# Patient Record
Sex: Male | Born: 1960 | Race: Black or African American | Hispanic: No | Marital: Married | Smoking: Current every day smoker
Health system: Southern US, Community
[De-identification: ages and names within clinical notes are randomized; demographics above are authoritative.]

## PROBLEM LIST (undated history)

## (undated) DIAGNOSIS — I10 Essential (primary) hypertension: Secondary | ICD-10-CM

## (undated) HISTORY — PX: NO PAST SURGERIES: SHX2092

---

## 2009-01-16 ENCOUNTER — Ambulatory Visit: Payer: Self-pay | Admitting: Internal Medicine

## 2009-01-25 ENCOUNTER — Ambulatory Visit: Payer: Self-pay | Admitting: Internal Medicine

## 2010-10-20 ENCOUNTER — Ambulatory Visit: Payer: Self-pay | Admitting: Internal Medicine

## 2014-02-25 ENCOUNTER — Ambulatory Visit: Payer: Self-pay | Admitting: Family Medicine

## 2015-11-14 ENCOUNTER — Encounter: Payer: Self-pay | Admitting: Emergency Medicine

## 2015-11-14 ENCOUNTER — Ambulatory Visit
Admission: EM | Admit: 2015-11-14 | Discharge: 2015-11-14 | Disposition: A | Discharge status: Home / Self Care | Payer: BLUE CROSS/BLUE SHIELD | Attending: Family Medicine | Admitting: Family Medicine

## 2015-11-14 DIAGNOSIS — J101 Influenza due to other identified influenza virus with other respiratory manifestations: Secondary | ICD-10-CM | POA: Diagnosis not present

## 2015-11-14 LAB — RAPID INFLUENZA A&B ANTIGENS (ARMC ONLY)
INFLUENZA A (ARMC): NOT DETECTED
INFLUENZA B (ARMC): DETECTED

## 2015-11-14 MED ORDER — ACETAMINOPHEN 500 MG PO TABS: 1000.0000 mg | ORAL_TABLET | Freq: Once | ORAL | Status: DC

## 2015-11-14 MED ORDER — GUAIFENESIN-CODEINE 100-10 MG/5ML PO SOLN
5.0000 mL | Freq: Three times a day (TID) | ORAL | Status: DC | PRN
Start: 2015-11-14 — End: 2020-06-02

## 2015-11-14 MED ORDER — OSELTAMIVIR PHOSPHATE 75 MG PO CAPS: 75.0000 mg | ORAL_CAPSULE | Freq: Two times a day (BID) | ORAL | Status: DC

## 2015-11-14 NOTE — Discharge Instructions (Signed)
Take medication as prescribed. Rest. Drink plenty of fluids. Take over the counter tylenol or ibuprofen as needed for pain or fever.  ° °Follow up with your primary care physician this week as needed. Return to Urgent care for new or worsening concerns.  ° ° °Influenza, Adult °Influenza ("the flu") is a viral infection of the respiratory tract. It occurs more often in winter months because people spend more time in close contact with one another. Influenza can make you feel very sick. Influenza easily spreads from person to person (contagious). °CAUSES  °Influenza is caused by a virus that infects the respiratory tract. You can catch the virus by breathing in droplets from an infected person's cough or sneeze. You can also catch the virus by touching something that was recently contaminated with the virus and then touching your mouth, nose, or eyes. °RISKS AND COMPLICATIONS °You may be at risk for a more severe case of influenza if you smoke cigarettes, have diabetes, have chronic heart disease (such as heart failure) or lung disease (such as asthma), or if you have a weakened immune system. Elderly people and pregnant women are also at risk for more serious infections. The most common problem of influenza is a lung infection (pneumonia). Sometimes, this problem can require emergency medical care and may be life threatening. °SIGNS AND SYMPTOMS  °Symptoms typically last 4 to 10 days and may include: °· Fever. °· Chills. °· Headache, body aches, and muscle aches. °· Sore throat. °· Chest discomfort and cough. °· Poor appetite. °· Weakness or feeling tired. °· Dizziness. °· Nausea or vomiting. °DIAGNOSIS  °Diagnosis of influenza is often made based on your history and a physical exam. A nose or throat swab test can be done to confirm the diagnosis. °TREATMENT  °In mild cases, influenza goes away on its own. Treatment is directed at relieving symptoms. For more severe cases, your health care provider may prescribe  antiviral medicines to shorten the sickness. Antibiotic medicines are not effective because the infection is caused by a virus, not by bacteria. °HOME CARE INSTRUCTIONS °· Take medicines only as directed by your health care provider. °· Use a cool mist humidifier to make breathing easier. °· Get plenty of rest until your temperature returns to normal. This usually takes 3 to 4 days. °· Drink enough fluid to keep your urine clear or pale yellow. °· Cover your mouth and nose when coughing or sneezing, and wash your hands well to prevent the virus from spreading. °· Stay home from work or school until the fever is gone for at least 1 full day. °PREVENTION  °An annual influenza vaccination (flu shot) is the best way to avoid getting influenza. An annual flu shot is now routinely recommended for all adults in the [place] °SEEK MEDICAL CARE IF: °· You experience chest pain, your cough worsens, or you produce more mucus. °· You have nausea, vomiting, or diarrhea. °· Your fever returns or gets worse. °SEEK IMMEDIATE MEDICAL CARE IF: °· You have trouble breathing, you become short of breath, or your skin or nails become bluish. °· You have severe pain or stiffness in the neck. °· You develop a sudden headache, or pain in the face or ear. °· You have nausea or vomiting that you cannot control. °MAKE SURE YOU:  °· Understand these instructions. °· Will watch your condition. °· Will get help right away if you are not doing well or get worse. °  °This information is not intended to replace advice given to you by your health care provider. Make sure you discuss any questions you have with your health care provider. °  °Document Released: 09/05/2000 Document   09/29/2014 Document Reviewed: 12/08/2011 Elsevier Interactive Patient Education [employer].

## 2015-11-14 NOTE — ED Provider Notes (Signed)
[facility]  ____________________________________________  Time seen: Approximately 11:32 AM  I have reviewed the triage vital signs and the nursing notes.   HISTORY  Chief Complaint Influenza   HPI Phillip Nichols is a 55 y.o. male  presents with wife at bedside for the complaints of 2 days of sudden onset of fever, cough, congestion, runny nose. Reports continues to eat and drink well. Reports cough is dry. Reports cough kept him up last night from sleeping. Reports only took ibuprofen once yesterday and has not taken any other over-the-counter cough, congestion, Tylenol or ibuprofen medications. States generalized body aches when fever is present. Denies other pain. Denies current pain.  Denies chest pain, shortness of breath, abdominal pain, dysuria, neck or back pain, dizziness or weakness.  Reports positive influenza exposure by son this past weekend. Also reports several coworkers sick with similar symptoms.   No past medical history on file.  There are no active problems to display for this patient.   No past surgical history on file.  Current Outpatient Rx  Name  Route  Sig  Dispense  Refill  .           Marland Kitchen             Allergies Review of patient's allergies indicates no known allergies.  History reviewed. No pertinent family history.  Social History Social History  Substance Use Topics  . Smoking status: Current Every Day Smoker -- 0.50 packs/day    Types: Cigarettes  . Smokeless tobacco: None  . Alcohol Use: Yes     Comment: occassional     Review of Systems Constitutional: Positive subjective fevers.  Eyes: No visual changes. ENT: No sore throat.Positive runny nose, nasal congestion, cough.  Cardiovascular: Denies chest pain. Respiratory: Denies shortness of breath. Gastrointestinal: No abdominal pain.  No nausea, no vomiting.  No diarrhea.  No constipation. Genitourinary: Negative for dysuria. Musculoskeletal: Negative for back pain. Skin:  Negative for rash. Neurological: Negative for headaches, focal weakness or numbness.  10-point ROS otherwise negative.  ____________________________________________   PHYSICAL EXAM:  VITAL SIGNS: ED Triage Vitals  Enc Vitals Group     BP 11/14/15 1020 145/89 mmHg     Pulse Rate 11/14/15 1020 111     Resp 11/14/15 1020 18     Temp 11/14/15 1020 100.4 F (38 C)     Temp Source 11/14/15 1020 Tympanic     SpO2 11/14/15 1020 98 %     Weight 11/14/15 1020 185 lb (83.915 kg)     Height 11/14/15 1020  (1.778 m)     Head Cir --      Peak Flow --      Pain Score 11/14/15 1021 8     Pain Loc --      Pain Edu? --      Excl. in GC? --     Constitutional: Alert and oriented. Well appearing and in no acute distress. Eyes: Conjunctivae are normal. PERRL. EOMI. Head: Atraumatic.No sinus tenderness to palpation. No swelling. No erythema.  Ears: no erythema, normal TMs bilaterally.   Nose:Nasal congestion with clear rhinorrhea.  Mouth/Throat: Mucous membranes are moist.  Oropharynx non-erythematous.No tonsillar swelling or exudate.  Neck: No stridor.  No cervical spine tenderness to palpation. Hematological/Lymphatic/Immunilogical: No cervical lymphadenopathy. Cardiovascular: Normal rate, regular rhythm. Grossly normal heart sounds.  Good peripheral circulation. Respiratory: Normal respiratory effort.  No retractions. Lungs CTAB.No wheezes, rales or rhonchi. Good air movement.  Gastrointestinal: Soft and nontender. No distention.  Normal Bowel sounds. No CVA tenderness. Musculoskeletal: No lower or upper extremity tenderness nor edema.  Bilateral pedal pulses equal and easily palpated.  Neurologic:  Normal speech and language. No gross focal neurologic deficits are appreciated. No gait instability. Skin:  Skin is warm, dry and intact. No rash noted. Psychiatric: Mood and affect are normal. Speech and behavior are normal.  ____________________________________________   LABS (all  labs ordered are listed, but only abnormal results are displayed)  Labs Reviewed  RAPID INFLUENZA A&B ANTIGENS ([facility] ONLY)    INITIAL IMPRESSION / ASSESSMENT AND PLAN / ED COURSE  Pertinent labs & imaging results that were available during my care of the patient were reviewed by me and considered in my medical decision making (see chart for details).  Very well-appearing patient. No acute distress. Presents with wife at bedside with complaints of 2 days of runny nose, nasal congestion, bodies and intermittent fever. Reports continues to eat and drink well. Lungs clear throughout. Abdomen soft and nontender. Moist membranes. Suspect viral upper respiratory infection such as influenza especially with known positive exposure. will evaluate for influenza.   Influenza B positive. Will treat patient with oral Tamiflu and when necessary guaifenesin with codeine as needed. Discussed do not drive while taking codeine. Encouraged rest, fluids, over-the-counter Tylenol or ibuprofen as needed. Tylenol 1 g orally given in urgent care. Encourage PCP follow up as needed.  Discussed follow up with Primary care physician this week. Discussed follow up and return parameters including no resolution or any worsening concerns. Patient verbalized understanding and agreed to plan.   ____________________________________________   FINAL CLINICAL IMPRESSION(S) / ED DIAGNOSES  Final diagnoses:  Influenza B      Note: This dictation was prepared with Dragon dictation along with smaller phrase technology. Any transcriptional errors that result from this process are unintentional.    Renford Dills, NP 11/14/15 1137

## 2015-11-14 NOTE — ED Notes (Signed)
Pt reports flu like symptoms including fever, cough, congestion, and body aches that started about 2 days ago

## 2018-10-15 ENCOUNTER — Other Ambulatory Visit: Payer: Self-pay

## 2018-10-15 ENCOUNTER — Emergency Department
Admission: EM | Admit: 2018-10-15 | Discharge: 2018-10-15 | Disposition: A | Discharge status: Home / Self Care | Payer: BLUE CROSS/BLUE SHIELD | Attending: Emergency Medicine | Admitting: Emergency Medicine

## 2018-10-15 ENCOUNTER — Emergency Department: Payer: BLUE CROSS/BLUE SHIELD

## 2018-10-15 DIAGNOSIS — S82891A Other fracture of right lower leg, initial encounter for closed fracture: Secondary | ICD-10-CM | POA: Diagnosis not present

## 2018-10-15 DIAGNOSIS — Y999 Unspecified external cause status: Secondary | ICD-10-CM | POA: Diagnosis not present

## 2018-10-15 DIAGNOSIS — S161XXA Strain of muscle, fascia and tendon at neck level, initial encounter: Secondary | ICD-10-CM | POA: Diagnosis not present

## 2018-10-15 DIAGNOSIS — F17210 Nicotine dependence, cigarettes, uncomplicated: Secondary | ICD-10-CM | POA: Insufficient documentation

## 2018-10-15 DIAGNOSIS — Z79899 Other long term (current) drug therapy: Secondary | ICD-10-CM | POA: Insufficient documentation

## 2018-10-15 DIAGNOSIS — Y92410 Unspecified street and highway as the place of occurrence of the external cause: Secondary | ICD-10-CM | POA: Insufficient documentation

## 2018-10-15 DIAGNOSIS — Y9389 Activity, other specified: Secondary | ICD-10-CM | POA: Insufficient documentation

## 2018-10-15 DIAGNOSIS — S199XXA Unspecified injury of neck, initial encounter: Secondary | ICD-10-CM | POA: Diagnosis present

## 2018-10-15 MED ORDER — IBUPROFEN 600 MG PO TABS
600.0000 mg | ORAL_TABLET | Freq: Once | ORAL | Status: AC
  Administered 2018-10-15: 600 mg via ORAL
  Filled 2018-10-15: qty 1

## 2018-10-15 MED ORDER — CYCLOBENZAPRINE HCL 10 MG PO TABS: 10.0000 mg | ORAL_TABLET | Freq: Three times a day (TID) | ORAL | 0 refills | Status: DC | PRN

## 2018-10-15 MED ORDER — TRAMADOL HCL 50 MG PO TABS
50.0000 mg | ORAL_TABLET | Freq: Once | ORAL | Status: AC
Start: 2018-10-15 — End: 2018-10-15
  Administered 2018-10-15: 50 mg via ORAL
  Filled 2018-10-15: qty 1

## 2018-10-15 MED ORDER — CYCLOBENZAPRINE HCL 10 MG PO TABS
10.0000 mg | ORAL_TABLET | Freq: Once | ORAL | Status: AC
  Administered 2018-10-15: 10 mg via ORAL
  Filled 2018-10-15: qty 1

## 2018-10-15 MED ORDER — IBUPROFEN 600 MG PO TABS: 600.0000 mg | ORAL_TABLET | Freq: Three times a day (TID) | ORAL | 0 refills | Status: DC | PRN

## 2018-10-15 MED ORDER — TRAMADOL HCL 50 MG PO TABS: 50.0000 mg | ORAL_TABLET | Freq: Two times a day (BID) | ORAL | 0 refills | Status: DC | PRN

## 2018-10-15 NOTE — Discharge Instructions (Addendum)
Wear splint and if needed ambulate with crutches until evaluation by orthopedics.
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
## 2020-06-02 ENCOUNTER — Ambulatory Visit
Admission: EM | Admit: 2020-06-02 | Discharge: 2020-06-02 | Disposition: A | Discharge status: Home / Self Care | Payer: BC Managed Care – PPO | Attending: Family | Admitting: Family

## 2020-06-02 ENCOUNTER — Other Ambulatory Visit: Payer: Self-pay

## 2020-06-02 DIAGNOSIS — R062 Wheezing: Secondary | ICD-10-CM

## 2020-06-02 DIAGNOSIS — J209 Acute bronchitis, unspecified: Secondary | ICD-10-CM

## 2020-06-02 DIAGNOSIS — R030 Elevated blood-pressure reading, without diagnosis of hypertension: Secondary | ICD-10-CM | POA: Diagnosis not present

## 2020-06-02 DIAGNOSIS — M898X1 Other specified disorders of bone, shoulder: Secondary | ICD-10-CM

## 2020-06-02 DIAGNOSIS — M25511 Pain in right shoulder: Secondary | ICD-10-CM | POA: Diagnosis not present

## 2020-06-02 DIAGNOSIS — J0100 Acute maxillary sinusitis, unspecified: Secondary | ICD-10-CM | POA: Diagnosis present

## 2020-06-02 DIAGNOSIS — Z20822 Contact with and (suspected) exposure to covid-19: Secondary | ICD-10-CM | POA: Insufficient documentation

## 2020-06-02 MED ORDER — ALBUTEROL SULFATE HFA 108 (90 BASE) MCG/ACT IN AERS: 2.0000 | INHALATION_SPRAY | Freq: Four times a day (QID) | RESPIRATORY_TRACT | 0 refills | Status: AC | PRN

## 2020-06-02 MED ORDER — PREDNISONE 20 MG PO TABS
40.0000 mg | ORAL_TABLET | Freq: Every day | ORAL | 0 refills | Status: AC
Start: 2020-06-02 — End: 2020-06-06

## 2020-06-02 MED ORDER — DOXYCYCLINE HYCLATE 100 MG PO CAPS: 100.0000 mg | ORAL_CAPSULE | Freq: Two times a day (BID) | ORAL | 0 refills | Status: AC

## 2020-06-02 NOTE — ED Provider Notes (Signed)
[facility]    CSN: [id number] Arrival date & time: 06/02/20  7035      History   Chief Complaint Chief Complaint  Patient presents with  . Cough    HPI Phillip Nichols is a 59 y.o. male.   59 year old male presents with nasal congestion, cough and chest congestion for the past 5 to 6 days. Denies any fever, sore throat or vomiting but has felt nauseous. Also started having right upper shoulder blade pain yesterday. Has been taking Nyquil and Mucinex with some relief. No known exposure to COVID 19. No other chronic health issues except tobacco use but has noted that blood pressure has been mildly elevated in the past. Has routine appointment with his PCP in 2 days. Takes no daily medication.   The history is provided by the patient.    History reviewed. No pertinent past medical history.  There are no problems to display for this patient.   Past Surgical History:  Procedure Laterality Date  . NO PAST SURGERIES         Home Medications    Prior to Admission medications   Medication Sig Start Date End Date Taking? Authorizing Provider  albuterol (VENTOLIN HFA) 108 (90 Base) MCG/ACT inhaler Inhale 2 puffs into the lungs every 6 (six) hours as needed for wheezing. 06/02/20   Sudie Grumbling, NP  doxycycline (VIBRAMYCIN) 100 MG capsule Take 1 capsule (100 mg total) by mouth 2 (two) times daily for 7 days. 06/02/20 06/09/20  Sudie Grumbling, NP  predniSONE (DELTASONE) 20 MG tablet Take 2 tablets (40 mg total) by mouth daily for 4 days. 06/02/20 06/06/20  Sudie Grumbling, NP    Family History Family History  Problem Relation Age of Onset  . Healthy Mother   . Healthy Father     Social History Social History   Tobacco Use  . Smoking status: Current Every Day Smoker    Packs/day: 0.50    Types: Cigarettes  . Smokeless tobacco: Never Used  Vaping Use  . Vaping Use: Never used  Substance Use Topics  . Alcohol use: Yes    Comment: occasional   . Drug use:  Yes    Types: Marijuana     Allergies   Patient has no known allergies.   Review of Systems Review of Systems  Constitutional: Positive for appetite change, chills and fatigue. Negative for activity change and fever.  HENT: Positive for congestion, postnasal drip, rhinorrhea, sinus pressure and sinus pain. Negative for ear discharge, ear pain, facial swelling, mouth sores, nosebleeds, sore throat and trouble swallowing.   Eyes: Negative for pain, discharge, redness and itching.  Respiratory: Positive for cough and chest tightness. Negative for wheezing.   Cardiovascular: Negative for chest pain.  Gastrointestinal: Positive for nausea. Negative for diarrhea and vomiting.  Musculoskeletal: Positive for arthralgias, back pain and myalgias. Negative for neck pain and neck stiffness.  Skin: Negative for color change, rash and wound.  Allergic/Immunologic: Negative for environmental allergies, food allergies and immunocompromised state.  Neurological: Positive for headaches. Negative for dizziness, seizures, syncope, weakness, light-headedness and numbness.  Hematological: Negative for adenopathy. Does not bruise/bleed easily.     Physical Exam Triage Vital Signs ED Triage Vitals  Enc Vitals Group     BP 06/02/20 0943 (!) 156/100     Pulse Rate 06/02/20 0943 79     Resp 06/02/20 0943 18     Temp 06/02/20 0943 98 F (36.7 C)  Temp Source 06/02/20 0943 Oral     SpO2 06/02/20 0943 99 %     Weight 06/02/20 0939 180 lb (81.6 kg)     Height 06/02/20 0939 5\' 9"  (1.753 m)     Head Circumference --      Peak Flow --      Pain Score 06/02/20 0939 6     Pain Loc --      Pain Edu? --      Excl. in GC? --    No data found.  Updated Vital Signs BP (!) 156/100 (BP Location: Right Arm)   Pulse 79   Temp 98 F (36.7 C) (Oral)   Resp 18   Ht 5\' 9"  (1.753 m)   Wt 180 lb (81.6 kg)   SpO2 99%   BMI 26.58 kg/m   Visual Acuity Right Eye Distance:   Left Eye Distance:   Bilateral  Distance:    Right Eye Near:   Left Eye Near:    Bilateral Near:     Physical Exam Vitals and nursing note reviewed.  Constitutional:      General: He is awake. He is not in acute distress.    Appearance: He is well-developed and well-groomed. He is ill-appearing.     Comments: He is sitting on the exam table in no acute distress but appears ill.   HENT:     Head: Normocephalic and atraumatic.     Right Ear: Hearing, tympanic membrane, ear canal and external ear normal.     Left Ear: Hearing, tympanic membrane, ear canal and external ear normal.     Nose: Congestion present.     Right Sinus: Maxillary sinus tenderness present. No frontal sinus tenderness.     Left Sinus: Maxillary sinus tenderness present. No frontal sinus tenderness.     Mouth/Throat:     Lips: Pink.     Mouth: Mucous membranes are moist.     Pharynx: Oropharynx is clear. Uvula midline. Posterior oropharyngeal erythema present. No pharyngeal swelling, oropharyngeal exudate or uvula swelling.  Eyes:     Extraocular Movements: Extraocular movements intact.     Conjunctiva/sclera: Conjunctivae normal.  Cardiovascular:     Rate and Rhythm: Normal rate and regular rhythm.     Heart sounds: Normal heart sounds. No murmur heard.   Pulmonary:     Effort: Pulmonary effort is normal. No respiratory distress.     Breath sounds: Normal air entry. No decreased air movement. Examination of the right-lower field reveals wheezing and rhonchi. Examination of the left-lower field reveals wheezing and rhonchi. Wheezing and rhonchi present. No decreased breath sounds or rales.       Comments: Right upper scapular tenderness and muscle spasms present.  Musculoskeletal:        General: Tenderness present.     Cervical back: Normal range of motion and neck supple. No rigidity.  Lymphadenopathy:     Cervical: No cervical adenopathy.  Skin:    General: Skin is warm and dry.     Capillary Refill: Capillary refill takes less than 2  seconds.     Findings: No erythema or rash.  Neurological:     General: No focal deficit present.     Mental Status: He is alert and oriented to person, place, and time.  Psychiatric:        Mood and Affect: Mood normal.        Behavior: Behavior normal. Behavior is cooperative.        Thought Content:  Thought content normal.        Judgment: Judgment normal.      UC Treatments / Results  Labs (all labs ordered are listed, but only abnormal results are displayed) Labs Reviewed  SARS CORONAVIRUS 2 (TAT 6-24 HRS)    EKG   Radiology No results found.  Procedures Procedures (including critical care time)  Medications Ordered in UC Medications - No data to display  Initial Impression / Assessment and Plan / UC Course  I have reviewed the triage vital signs and the nursing notes.  Pertinent labs & imaging results that were available during my care of the patient were reviewed by me and considered in my medical decision making (see chart for details).    Reviewed with patient that he appears to have a mild sinus infection with bronchitis and wheezing. Will start Doxycycline 100mg  twice a day as directed. May take Prednisone 40mg  daily for 4 days to help with right shoulderblade muscle pain, cough and wheezing. Recommend start Albuterol inhaler 2 puffs every 4 to 6 hours as needed for wheezing. Continue OTC Mucinex as needed. Continue to push fluids to help loosen up mucus in sinuses and chest. Continue to monitor blood pressure. Follow-up pending COVID 19 test results and with his PCP on Monday (2 days) as planned.  Final Clinical Impressions(s) / UC Diagnoses   Final diagnoses:  Acute non-recurrent maxillary sinusitis  Acute bronchitis, unspecified organism  Wheezing  Pain of right scapula  Elevated blood-pressure reading without diagnosis of hypertension     Discharge Instructions     Recommend start Doxycycline 100mg  twice a day as directed. May take Prednisone 40mg   daily in AM for 4 days. May use Albuterol inhaler 2 puffs every 6 hours as needed for wheezing or cough. Continue OTC Mucinex as directed. Continue to push fluids to help loosen up mucus in chest. Follow-up pending COVID 19 test results and with your PCP next week as planned.     ED Prescriptions    Medication Sig Dispense Auth. Provider   doxycycline (VIBRAMYCIN) 100 MG capsule Take 1 capsule (100 mg total) by mouth 2 (two) times daily for 7 days. 14 capsule , NP   albuterol (VENTOLIN HFA) 108 (90 Base) MCG/ACT inhaler Inhale 2 puffs into the lungs every 6 (six) hours as needed for wheezing. 18 g Tuesday, NP   predniSONE (DELTASONE) 20 MG tablet Take 2 tablets (40 mg total) by mouth daily for 4 days. 8 tablet Gianluca Chhim, , NP     PDMP not reviewed this encounter.   , NP 06/03/20 1730

## 2020-06-02 NOTE — Discharge Instructions (Addendum)
Recommend start Doxycycline 100mg  twice a day as directed. May take Prednisone 40mg  daily in AM for 4 days. May use Albuterol inhaler 2 puffs every 6 hours as needed for wheezing or cough. Continue OTC Mucinex as directed. Continue to push fluids to help loosen up mucus in chest. Follow-up pending COVID 19 test results and with your PCP next week as planned.

## 2020-06-02 NOTE — ED Triage Notes (Signed)
Patient complains of cough and congestion x Tuesday. States that he has also noticed some right shoulder blade pain that started yesterday but is unsure if this is related.

## 2020-06-03 LAB — SARS CORONAVIRUS 2 (TAT 6-24 HRS): SARS Coronavirus 2: NEGATIVE

## 2021-03-15 ENCOUNTER — Other Ambulatory Visit: Payer: Self-pay

## 2021-03-15 ENCOUNTER — Encounter: Payer: Self-pay | Admitting: Emergency Medicine

## 2021-03-15 ENCOUNTER — Ambulatory Visit
Admission: EM | Admit: 2021-03-15 | Discharge: 2021-03-15 | Disposition: A | Discharge status: Home / Self Care | Payer: BC Managed Care – PPO | Attending: Sports Medicine | Admitting: Sports Medicine

## 2021-03-15 DIAGNOSIS — R519 Headache, unspecified: Secondary | ICD-10-CM | POA: Diagnosis present

## 2021-03-15 DIAGNOSIS — R509 Fever, unspecified: Secondary | ICD-10-CM | POA: Diagnosis present

## 2021-03-15 DIAGNOSIS — B349 Viral infection, unspecified: Secondary | ICD-10-CM

## 2021-03-15 DIAGNOSIS — Z20822 Contact with and (suspected) exposure to covid-19: Secondary | ICD-10-CM

## 2021-03-15 DIAGNOSIS — R059 Cough, unspecified: Secondary | ICD-10-CM | POA: Diagnosis present

## 2021-03-15 DIAGNOSIS — R0981 Nasal congestion: Secondary | ICD-10-CM | POA: Diagnosis present

## 2021-03-15 DIAGNOSIS — M791 Myalgia, unspecified site: Secondary | ICD-10-CM

## 2021-03-15 NOTE — Discharge Instructions (Addendum)
As we discussed, we sent the COVID test to the hospital.  It will take between 6 and 24 hours to return.  Please follow along on your phone with the MyChart. I provided a work note indicating that your COVID test was pending but that you are out of work as we need to treat you as though you are positive until your test results come back. Please see educational handouts provided. Plenty of rest, plenty fluids, Tylenol or Motrin for any fever or discomfort. As we discussed, the COVID antiviral medications could be prescribed but they are not without side effects.  Your vital signs examination as well as your symptoms and your past medical history would not indicate that we should prescribe that today.  Certainly, if your symptoms worsen that could be an option. If your symptoms persist please see your primary care provider. If your symptoms worsen please go to the emergency room.

## 2021-03-15 NOTE — ED Triage Notes (Signed)
Patient c/o cough, sneezing, bodyaches, and low grade fever that started yesterday.  Patient took a home covid test this morning and was positive.

## 2021-03-15 NOTE — ED Provider Notes (Signed)
[facility]    CSN: [id number] Arrival date & time: 03/15/21  1256      History   Chief Complaint Chief Complaint  Patient presents with   Cough    COVID + home test    HPI Phillip Nichols is a 60 y.o. male.   Patient is a 60 year old male who presents for evaluation of the above issue.  He normally is seen at [facility].  He called them with hopes of initiating treatment for a home COVID test as well as received a work note.  They were not able to meet his needs as his workplace wanted him to have a COVID test done in the clinical setting and would not except a home test.  He works at [employer].  He reports 2 days of URI symptoms including headache, fever, myalgias, nasal congestion, cough, sneezing, and decreased appetite.  He denies any chest pain or shortness of breath.  No known COVID exposure.  He has been vaccinated x2 but no booster.  He is also received his flu shot.  He has no significant past medical history to speak of.  When I reviewed the chart it appears as though he does have hypertension and hyperlipidemia.  He also has GERD and hepatitis C and is in remission after completing the treatment course with GI.  He denies chest pain shortness of breath or any red flag signs or symptoms.   History reviewed. No pertinent past medical history.  There are no problems to display for this patient.   Past Surgical History:  Procedure Laterality Date   NO PAST SURGERIES         Home Medications    Prior to Admission medications   Medication Sig Start Date End Date Taking? Authorizing Provider  albuterol (VENTOLIN HFA) 108 (90 Base) MCG/ACT inhaler Inhale 2 puffs into the lungs every 6 (six) hours as needed for wheezing. 06/02/20   AmyotAli Lowe, NP    Family History Family History  Problem Relation Age of Onset   Healthy Mother    Healthy Father     Social History Social History   Tobacco Use   Smoking status: Every Day    Packs/day:  0.50    Pack years: 0.00    Types: Cigarettes   Smokeless tobacco: Never  Vaping Use   Vaping Use: Never used  Substance Use Topics   Alcohol use: Yes    Comment: occasional    Drug use: Yes    Types: Marijuana     Allergies   Patient has no known allergies.   Review of Systems Review of Systems  Constitutional:  Positive for activity change and fever. Negative for appetite change, chills, diaphoresis and fatigue.  HENT:  Positive for congestion and sneezing. Negative for ear pain, postnasal drip, rhinorrhea, sinus pressure, sinus pain and sore throat.   Eyes:  Negative for pain.  Respiratory:  Positive for cough. Negative for chest tightness, shortness of breath and wheezing.   Cardiovascular:  Negative for chest pain and palpitations.  Gastrointestinal:  Negative for abdominal pain, diarrhea, nausea and vomiting.  Genitourinary:  Negative for dysuria.  Musculoskeletal:  Positive for myalgias. Negative for back pain and neck pain.  Skin:  Negative for color change, pallor, rash and wound.  Neurological:  Positive for headaches. Negative for dizziness and light-headedness.  All other systems reviewed and are negative.   Physical Exam Triage Vital Signs ED Triage Vitals  Enc Vitals Group  BP 03/15/21 1309 (!) 165/98     Pulse Rate 03/15/21 1309 (!) 104     Resp 03/15/21 1309 16     Temp 03/15/21 1309 98.2 F (36.8 C)     Temp Source 03/15/21 1309 Oral     SpO2 03/15/21 1309 98 %     Weight 03/15/21 1307 180 lb (81.6 kg)     Height 03/15/21 1307 5\' 9"  (1.753 m)     Head Circumference --      Peak Flow --      Pain Score 03/15/21 1307 8     Pain Loc --      Pain Edu? --      Excl. in GC? --    No data found.  Updated Vital Signs BP (!) 165/98 (BP Location: Left Arm)   Pulse (!) 104   Temp 98.2 F (36.8 C) (Oral)   Resp 16   Ht 5\' 9"  (1.753 m)   Wt 81.6 kg   SpO2 98%   BMI 26.58 kg/m   Visual Acuity Right Eye Distance:   Left Eye Distance:    Bilateral Distance:    Right Eye Near:   Left Eye Near:    Bilateral Near:     Physical Exam Vitals and nursing note reviewed.  Constitutional:      General: He is not in acute distress.    Appearance: Normal appearance. He is not ill-appearing, toxic-appearing or diaphoretic.  HENT:     Head: Normocephalic and atraumatic.     Right Ear: Tympanic membrane normal.     Nose: Congestion present. No rhinorrhea.     Mouth/Throat:     Mouth: Mucous membranes are moist.     Pharynx: Posterior oropharyngeal erythema present. No oropharyngeal exudate.  Eyes:     General: No scleral icterus.       Right eye: No discharge.        Left eye: No discharge.     Conjunctiva/sclera: Conjunctivae normal.     Pupils: Pupils are equal, round, and reactive to light.  Cardiovascular:     Rate and Rhythm: Normal rate and regular rhythm.     Pulses: Normal pulses.     Heart sounds: Normal heart sounds. No murmur heard.   No friction rub. No gallop.     Comments: Recheck heart rate was 96 bpm. Pulmonary:     Effort: Pulmonary effort is normal.     Breath sounds: Normal breath sounds. No stridor. No wheezing, rhonchi or rales.  Musculoskeletal:     Cervical back: Normal range of motion and neck supple.  Skin:    General: Skin is warm and dry.     Capillary Refill: Capillary refill takes less than 2 seconds.  Neurological:     General: No focal deficit present.     Mental Status: He is alert and oriented to person, place, and time.     GCS: GCS eye subscore is 4. GCS verbal subscore is 5. GCS motor subscore is 6.     Cranial Nerves: Cranial nerves are intact.     Sensory: Sensation is intact.     Motor: Motor function is intact.     Coordination: Coordination is intact.     Deep Tendon Reflexes: Reflexes are normal and symmetric.     UC Treatments / Results  Labs (all labs ordered are listed, but only abnormal results are displayed) Labs Reviewed  SARS CORONAVIRUS 2 (TAT 6-24 HRS) -  Abnormal; Notable for the  following components:      Result Value   SARS Coronavirus 2 POSITIVE (*)    All other components within normal limits    EKG   Radiology No results found.  Procedures Procedures (including critical care time)  Medications Ordered in UC Medications - No data to display  Initial Impression / Assessment and Plan / UC Course  I have reviewed the triage vital signs and the nursing notes.  Pertinent labs & imaging results that were available during my care of the patient were reviewed by me and considered in my medical decision making (see chart for details).  Clinical impression: 60 year old male presents with 2 days of URI symptoms and a positive COVID test at home.  Treatment plan: 1.  The findings and treatment plan were discussed in detail with the patient.  Patient was in agreement. 2.  We will go ahead and get his COVID test and send it to the hospital. 3.  Did give him a work note asking them to isolate until he has documented positive test through the hospital and that he will need to quarantine per current CDC guidelines. 4.  Educational handouts provided. 5.  Plenty of rest, plenty fluids, Tylenol or Motrin for any fever or discomfort. 6.  We discussed doing the COVID antiviral medications but discussed the side effects and he wanted to defer on that.  He is not overly sick today and his past medical history is not too significant.  If that changes they can always initiate that at a future date. 7.  If symptoms persist he should see his PCP. 8.  If they worsen he knows to go to the ER. 9.  He was stable upon discharge and he will follow-up here as needed.    Final Clinical Impressions(s) / UC Diagnoses   Final diagnoses:  Encounter for laboratory testing for COVID-19 virus  Febrile illness, acute  Cough  Headache due to viral infection  Myalgia  Nasal congestion     Discharge Instructions      As we discussed, we sent the COVID test  to the hospital.  It will take between 6 and 24 hours to return.  Please follow along on your phone with the MyChart. I provided a work note indicating that your COVID test was pending but that you are out of work as we need to treat you as though you are positive until your test results come back. Please see educational handouts provided. Plenty of rest, plenty fluids, Tylenol or Motrin for any fever or discomfort. As we discussed, the COVID antiviral medications could be prescribed but they are not without side effects.  Your vital signs examination as well as your symptoms and your past medical history would not indicate that we should prescribe that today.  Certainly, if your symptoms worsen that could be an option. If your symptoms persist please see your primary care provider. If your symptoms worsen please go to the emergency room.     ED Prescriptions   None    PDMP not reviewed this encounter.   Delton See, MD 03/16/21 1009

## 2021-03-16 LAB — SARS CORONAVIRUS 2 (TAT 6-24 HRS): SARS Coronavirus 2: POSITIVE — AB

## 2021-08-30 ENCOUNTER — Other Ambulatory Visit: Payer: Self-pay

## 2021-08-30 ENCOUNTER — Encounter: Payer: Self-pay | Admitting: Emergency Medicine

## 2021-08-30 ENCOUNTER — Ambulatory Visit
Admission: EM | Admit: 2021-08-30 | Discharge: 2021-08-30 | Disposition: A | Discharge status: Home / Self Care | Payer: BC Managed Care – PPO

## 2021-08-30 DIAGNOSIS — S46812A Strain of other muscles, fascia and tendons at shoulder and upper arm level, left arm, initial encounter: Secondary | ICD-10-CM

## 2021-08-30 HISTORY — DX: Essential (primary) hypertension: I10

## 2021-08-30 MED ORDER — MELOXICAM 7.5 MG PO TABS: 7.5000 mg | ORAL_TABLET | Freq: Every day | ORAL | 0 refills | Status: AC

## 2021-08-30 MED ORDER — CYCLOBENZAPRINE HCL 5 MG PO TABS: 5.0000 mg | ORAL_TABLET | Freq: Three times a day (TID) | ORAL | 0 refills | Status: DC | PRN

## 2021-08-30 NOTE — ED Provider Notes (Signed)
[facility]    CSN: [id number] Arrival date & time: 08/30/21  1450      History   Chief Complaint Chief Complaint  Patient presents with   Neck Pain   Shoulder Pain    HPI Phillip Nichols is a 60 y.o. male. Pt presents with his wife  HPI  Neck Pain: Pt reports that he has had neck and shoulder pain for the past 1 day. No known injury.  He reports that he woke up with the pain.  The day previously he was doing heavy lifting at work.  He states that the pain worsens when he tries to move his left shoulder or neck.  No numbness or tingling.  No loss of grip strength, fever or history of spinal injury.  He has tried a heat pad and ibuprofen on the area with mild improvement. Past Medical History:  Diagnosis Date   Hypertension     There are no problems to display for this patient.   Past Surgical History:  Procedure Laterality Date   NO PAST SURGERIES         Home Medications    Prior to Admission medications   Medication Sig Start Date End Date Taking? Authorizing Provider  atorvastatin (LIPITOR) 40 MG tablet Take 40 mg by mouth daily. 08/05/21  Yes [provider]  spironolactone (ALDACTONE) 25 MG tablet Take 1 tablet by mouth 2 (two) times daily. 06/04/21 06/04/22 Yes [provider]  albuterol (VENTOLIN HFA) 108 (90 Base) MCG/ACT inhaler Inhale 2 puffs into the lungs every 6 (six) hours as needed for wheezing. 06/02/20   AmyotAli Lowe, NP    Family History Family History  Problem Relation Age of Onset   Healthy Mother    Healthy Father     Social History Social History   Tobacco Use   Smoking status: Every Day    Packs/day: 0.50    Types: Cigarettes   Smokeless tobacco: Never  Vaping Use   Vaping Use: Never used  Substance Use Topics   Alcohol use: Yes    Comment: occasional    Drug use: Yes    Types: Marijuana     Allergies   Patient has no known allergies.   Review of Systems Review of Systems  As stated  above in HPI Physical Exam Triage Vital Signs ED Triage Vitals  Enc Vitals Group     BP 08/30/21 1621 (!) 148/103     Pulse Rate 08/30/21 1621 97     Resp 08/30/21 1621 16     Temp 08/30/21 1621 98.3 F (36.8 C)     Temp Source 08/30/21 1621 Oral     SpO2 08/30/21 1621 99 %     Weight 08/30/21 1619 187 lb (84.8 kg)     Height 08/30/21 1619 5\' 10"  (1.778 m)     Head Circumference --      Peak Flow --      Pain Score 08/30/21 1619 10     Pain Loc --      Pain Edu? --      Excl. in GC? --    No data found.  Updated Vital Signs BP (!) 148/103 (BP Location: Left Arm)   Pulse 97   Temp 98.3 F (36.8 C) (Oral)   Resp 16   Ht 5\' 10"  (1.778 m)   Wt 187 lb (84.8 kg)   SpO2 99%   BMI 26.83 kg/m   Physical Exam Vitals and nursing note reviewed.  Constitutional:      General: He is not in acute distress.    Appearance: Normal appearance. He is not ill-appearing, toxic-appearing or diaphoretic.  HENT:     Head: Normocephalic and atraumatic.  Eyes:     Extraocular Movements: Extraocular movements intact.     Pupils: Pupils are equal, round, and reactive to light.  Cardiovascular:     Rate and Rhythm: Normal rate and regular rhythm.     Heart sounds: Normal heart sounds.  Pulmonary:     Effort: Pulmonary effort is normal.     Breath sounds: Normal breath sounds.  Musculoskeletal:        General: Tenderness (palpable muscle tension of left trapezius muscle in multiple areas.) present. No swelling or deformity. Normal range of motion.     Cervical back: Normal range of motion and neck supple. No rigidity or tenderness.  Lymphadenopathy:     Cervical: No cervical adenopathy.  Skin:    Capillary Refill: Capillary refill takes less than 2 seconds.  Neurological:     Mental Status: He is alert and oriented to person, place, and time.     Sensory: No sensory deficit.     Motor: No weakness.     UC Treatments / Results  Labs (all labs ordered are listed, but only abnormal  results are displayed) Labs Reviewed - No data to display  EKG   Radiology No results found.  Procedures Procedures (including critical care time)  Medications Ordered in UC Medications - No data to display  Initial Impression / Assessment and Plan / UC Course  I have reviewed the triage vital signs and the nursing notes.  Pertinent labs & imaging results that were available during my care of the patient were reviewed by me and considered in my medical decision making (see chart for details).     New.  Discussed trapezius muscle strain with patient.  He will use tennis ball massage, warm compress, Flexeril and Mobic.  We discussed how to use these medications along with common potential side effects and precautions.  He can use Tylenol if needed for additional pain but should avoid any other NSAIDs.  We discussed that the Mobic medication can raise blood pressure so he needs to follow a low-salt diet and monitor his blood pressure closely over the next few days and stop this medication if needed. We discussed red flag signs and symptoms.  Follow-up as needed. Work note given.  Final Clinical Impressions(s) / UC Diagnoses   Final diagnoses:  None   Discharge Instructions   None    ED Prescriptions   None    PDMP not reviewed this encounter.   Rushie Chestnut, [place] 08/30/21 1717

## 2021-08-30 NOTE — ED Triage Notes (Signed)
Patient reports pain and stiffness in his neck especially on the left side and radiates down into his left shoulder that started yesterday.  Patient denies injury or fall.  Patient states pain is worse when he moves or turns his head.

## 2022-03-12 ENCOUNTER — Other Ambulatory Visit: Payer: Self-pay

## 2022-03-12 ENCOUNTER — Emergency Department: Payer: 59

## 2022-03-12 ENCOUNTER — Emergency Department
Admission: EM | Admit: 2022-03-12 | Discharge: 2022-03-12 | Disposition: A | Discharge status: Home / Self Care | Payer: 59 | Attending: Emergency Medicine | Admitting: Emergency Medicine

## 2022-03-12 DIAGNOSIS — S161XXA Strain of muscle, fascia and tendon at neck level, initial encounter: Secondary | ICD-10-CM | POA: Diagnosis not present

## 2022-03-12 DIAGNOSIS — I10 Essential (primary) hypertension: Secondary | ICD-10-CM | POA: Insufficient documentation

## 2022-03-12 DIAGNOSIS — X500XXA Overexertion from strenuous movement or load, initial encounter: Secondary | ICD-10-CM | POA: Insufficient documentation

## 2022-03-12 DIAGNOSIS — S199XXA Unspecified injury of neck, initial encounter: Secondary | ICD-10-CM | POA: Diagnosis present

## 2022-03-12 DIAGNOSIS — Y990 Civilian activity done for income or pay: Secondary | ICD-10-CM | POA: Insufficient documentation

## 2022-03-12 LAB — CBC WITH DIFFERENTIAL/PLATELET
Abs Immature Granulocytes: 0.03 10*3/uL (ref 0.00–0.07)
Basophils Absolute: 0 10*3/uL (ref 0.0–0.1)
Basophils Relative: 1 %
Eosinophils Absolute: 0.1 10*3/uL (ref 0.0–0.5)
Eosinophils Relative: 1 %
HCT: 39.3 % (ref 39.0–52.0)
Hemoglobin: 13 g/dL (ref 13.0–17.0)
Immature Granulocytes: 0 %
Lymphocytes Relative: 33 %
Lymphs Abs: 2.6 10*3/uL (ref 0.7–4.0)
MCH: 30 pg (ref 26.0–34.0)
MCHC: 33.1 g/dL (ref 30.0–36.0)
MCV: 90.6 fL (ref 80.0–100.0)
Monocytes Absolute: 0.6 10*3/uL (ref 0.1–1.0)
Monocytes Relative: 7 %
Neutro Abs: 4.6 10*3/uL (ref 1.7–7.7)
Neutrophils Relative %: 58 %
Platelets: 304 10*3/uL (ref 150–400)
RBC: 4.34 MIL/uL (ref 4.22–5.81)
RDW: 14.3 % (ref 11.5–15.5)
WBC: 8 10*3/uL (ref 4.0–10.5)
nRBC: 0 % (ref 0.0–0.2)

## 2022-03-12 LAB — BASIC METABOLIC PANEL
Anion gap: 7 (ref 5–15)
BUN: 15 mg/dL (ref 6–20)
CO2: 28 mmol/L (ref 22–32)
Calcium: 9.3 mg/dL (ref 8.9–10.3)
Chloride: 105 mmol/L (ref 98–111)
Creatinine, Ser: 0.82 mg/dL (ref 0.61–1.24)
GFR, Estimated: >60 mL/min (ref >60)
Glucose, Bld: 101 mg/dL — ABNORMAL HIGH (ref 70–99)
Potassium: 3.6 mmol/L (ref 3.5–5.1)
Sodium: 140 mmol/L (ref 135–145)

## 2022-03-12 MED ORDER — KETOROLAC TROMETHAMINE 10 MG PO TABS: 10.0000 mg | ORAL_TABLET | Freq: Three times a day (TID) | ORAL | 0 refills | Status: AC

## 2022-03-12 MED ORDER — KETOROLAC TROMETHAMINE 15 MG/ML IJ SOLN
15.0000 mg | Freq: Once | INTRAMUSCULAR | Status: AC
  Administered 2022-03-12: 15 mg via INTRAVENOUS
  Filled 2022-03-12: qty 1

## 2022-03-12 MED ORDER — DICLOFENAC SODIUM 1 % EX GEL: 2.0000 g | Freq: Four times a day (QID) | CUTANEOUS | 0 refills | Status: AC

## 2022-03-12 MED ORDER — CYCLOBENZAPRINE HCL 10 MG PO TABS
10.0000 mg | ORAL_TABLET | Freq: Once | ORAL | Status: AC
  Administered 2022-03-12: 10 mg via ORAL
  Filled 2022-03-12: qty 1

## 2022-03-12 MED ORDER — CYCLOBENZAPRINE HCL 10 MG PO TABS: 10.0000 mg | ORAL_TABLET | Freq: Three times a day (TID) | ORAL | 0 refills | Status: AC | PRN

## 2022-03-12 MED ORDER — IOHEXOL 350 MG/ML SOLN
75.0000 mL | Freq: Once | INTRAVENOUS | Status: AC | PRN
  Administered 2022-03-12: 75 mL via INTRAVENOUS

## 2022-03-12 NOTE — ED Provider Notes (Signed)
[facility] Provider Note    Event Date/Time   First MD Initiated Contact with Patient 03/12/22 [phone]     (approximate)   History   Neck Pain   HPI  Phillip Nichols is a 61 y.o. male with a history of hypertension who presents for evaluation of neck pain and stiffness.  Patient reports that his symptoms started 2 days ago after work.  He was lifting some heavy things at work.  The pain is worse with movement of the head.  He cannot fully turn his head from side to side.  He denies any weakness or paresthesias of his upper extremities, no chest pain.  He reports that the neck pain radiates into his head and is giving him a headache as well.     Past Medical History:  Diagnosis Date   Hypertension     Past Surgical History:  Procedure Laterality Date   NO PAST SURGERIES       Physical Exam   Triage Vital Signs: ED Triage Vitals  Enc Vitals Group     BP 03/12/22 0157 (!) 178/107     Pulse Rate 03/12/22 0157 94     Resp 03/12/22 0157 17     Temp 03/12/22 0157 98.1 F (36.7 C)     Temp Source 03/12/22 0157 Oral     SpO2 03/12/22 0157 98 %     Weight 03/12/22 0159 185 lb (83.9 kg)     Height 03/12/22 0159 5\' 10"  (1.778 m)     Head Circumference --      Peak Flow --      Pain Score 03/12/22 0159 10     Pain Loc --      Pain Edu? --      Excl. in GC? --     Most recent vital signs: Vitals:   03/12/22 0157 03/12/22 0515  BP: (!) 178/107 (!) 160/102  Pulse: 94 87  Resp: 17 16  Temp: 98.1 F (36.7 C)   SpO2: 98% 100%     Constitutional: Alert and oriented. Well appearing and in no apparent distress. HEENT:      Head: Normocephalic and atraumatic.         Eyes: Conjunctivae are normal. Sclera is non-icteric.       Mouth/Throat: Mucous membranes are moist.       Neck: Supple with no signs of meningismus. Trapezius muscle on bilateral base of the neck are tense. Decresed ROM due to stiffness and pain Cardiovascular: Regular rate and  rhythm. No murmurs, gallops, or rubs. 2+ symmetrical distal pulses are present in all extremities.  Respiratory: Normal respiratory effort. Lungs are clear to auscultation bilaterally.  Gastrointestinal: Soft, non tender. Musculoskeletal:  No edema, cyanosis, or erythema of extremities. Neurologic: Normal speech and language. Face is symmetric. Moving all extremities. No gross focal neurologic deficits are appreciated. Skin: Skin is warm, dry and intact. No rash noted. Psychiatric: Mood and affect are normal. Speech and behavior are normal.  ED Results / Procedures / Treatments   Labs (all labs ordered are listed, but only abnormal results are displayed) Labs Reviewed  BASIC METABOLIC PANEL - Abnormal; Notable for the following components:      Result Value   Glucose, Bld 101 (*)    All other components within normal limits  CBC WITH DIFFERENTIAL/PLATELET     EKG  none   RADIOLOGY I, 03/14/22, attending MD, have personally viewed and interpreted the images obtained during this visit  as below:  CT negative   ___________________________________________________ Interpretation by Radiologist:  CT Angio Neck W and/or Wo Contrast  Result Date: 03/12/2022 CLINICAL DATA:  Neck trauma with arterial injury suspected EXAM: CT ANGIOGRAPHY NECK TECHNIQUE: Multidetector CT imaging of the neck was performed using the standard protocol during bolus administration of intravenous contrast. Multiplanar CT image reconstructions and MIPs were obtained to evaluate the vascular anatomy. Carotid stenosis measurements (when applicable) are obtained utilizing NASCET criteria, using the distal internal carotid diameter as the denominator. RADIATION DOSE REDUCTION: This exam was performed according to the departmental dose-optimization program which includes automated exposure control, adjustment of the mA and/or kV according to patient size and/or use of iterative reconstruction technique.  CONTRAST:  14mL OMNIPAQUE IOHEXOL 350 MG/ML SOLN COMPARISON:  None Available. FINDINGS: Aortic arch: Normal with 3 vessel branching. Right carotid system: Mild atheromatous plaque at the bifurcation. No stenosis or ulceration. No evidence of dissection Left carotid system: Mild atheromatous plaque at the bifurcation. No stenosis or ulceration. No evidence of dissection. Vertebral arteries: No proximal subclavian stenosis. The vertebral arteries are smoothly contoured and widely patent to the dura. Just beyond the left vertebral origin is a branch infundibulum that is incidental. Skeleton: Ordinary cervical spine degeneration. Other neck: No visible soft tissue injury or inflammation. Upper chest: Clear apical lungs IMPRESSION: No acute vascular finding.  Mild carotid atherosclerosis. Electronically Signed   By: Jorje Guild M.D.   On: 03/12/2022 06:20      PROCEDURES:  Critical Care performed: No  Procedures    IMPRESSION / MDM / [place] / ED COURSE  I reviewed the triage vital signs and the nursing notes.  61 y.o. male with a history of hypertension who presents for evaluation of neck pain and stiffness.  On exam patient is hypertensive with stiffness and decreased range of motion of the neck and pretty tense trapezius on palpation bilaterally  Ddx: Torticollis versus trapezius strain versus cervical artery dissection   Plan: CBC, BMP, CT angio of the neck.  If that is negative we will treat with IV Toradol and expect discharge patient home on Toradol and Flexeril   MEDICATIONS GIVEN IN ED: Medications  ketorolac (TORADOL) 15 MG/ML injection 15 mg (has no administration in time range)  cyclobenzaprine (FLEXERIL) tablet 10 mg (has no administration in time range)  iohexol (OMNIPAQUE) 350 MG/ML injection 75 mL (75 mLs Intravenous Contrast Given 03/12/22 0600)     ED COURSE: CTA of the neck was negative.  Labs within normal limits.  Patient was given a dose of IV Toradol  and Flexeril.  Will discharge home on Toradol, Flexeril, and Voltaren gel, discuss applying heat and performing frequent range of motion exercises, close follow-up with primary care doctor.  Discussed my standard return precautions   Consults: None   EMR reviewed including patient's last visit and with his primary care doctor from September 2022 for hyperlipidemia and hypertension    FINAL CLINICAL IMPRESSION(S) / ED DIAGNOSES   Final diagnoses:  Acute strain of neck muscle, initial encounter     Rx / DC Orders   ED Discharge Orders          Ordered    diclofenac Sodium (VOLTAREN) 1 % GEL  4 times daily        03/12/22 0632    cyclobenzaprine (FLEXERIL) 10 MG tablet  3 times daily PRN        03/12/22 0632    ketorolac (TORADOL) 10 MG tablet  Every 8  hours        03/12/22 3748             Note:  This document was prepared using Dragon voice recognition software and may include unintentional dictation errors.   Please note:  Patient was evaluated in Emergency Department today for the symptoms described in the history of present illness. Patient was evaluated in the context of the global COVID-19 pandemic, which necessitated consideration that the patient might be at risk for infection with the SARS-CoV-2 virus that causes COVID-19. Institutional protocols and algorithms that pertain to the evaluation of patients at risk for COVID-19 are in a state of rapid change based on information released by regulatory bodies including the CDC and federal and state organizations. These policies and algorithms were followed during the patient's care in the ED.  Some ED evaluations and interventions may be delayed as a result of limited staffing during the pandemic.       Don Perking, [place], MD 03/12/22 [phone]

## 2022-03-12 NOTE — ED Triage Notes (Signed)
Pt presents to ER from home c/o neck pain on-going since Monday.  Pt states he did some lifting at work on Friday last week and Monday this week, with pain beginning afterwards.  Pt states pain is worse with movement of head or arms.  Pt is A&O x4 at this time in NAD in triage.

## 2023-10-07 IMAGING — CT CT ANGIO NECK
2 of 7 series · 8 of 33 positions shown · IV contrast (APPLIED)
Comparison: None Available.

CLINICAL DATA: Neck trauma with arterial injury suspected



[Series 5: cta neck · axial · 0.59mm/px · z∈[-221,-137]mm · 2 of 126 slices shown]
[im 42/126  soft-tissue]
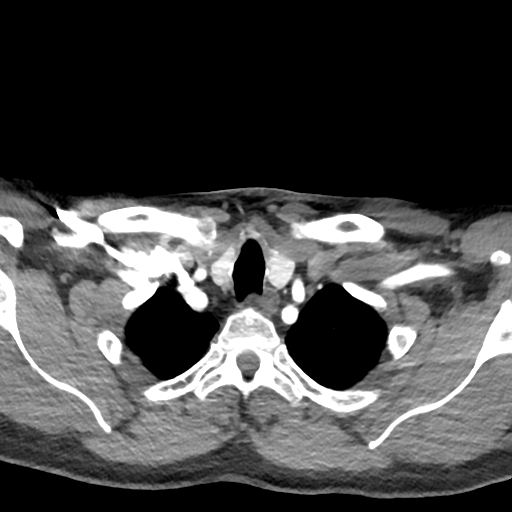
[im 84/126  soft-tissue]
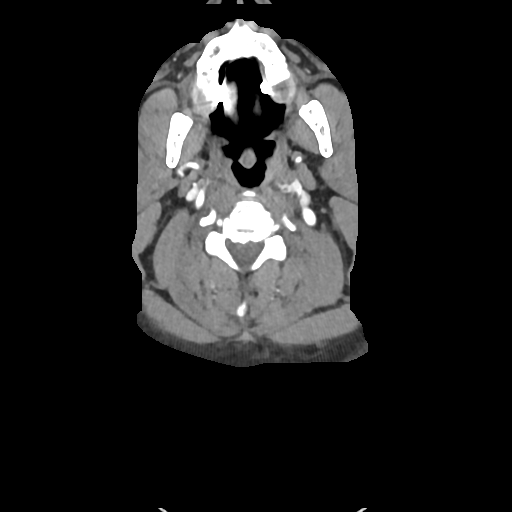

[Series 7: ax thins · axial · 0.39mm/px · z∈[-268,-88]mm · 6 of 252 slices shown]
[im 36/252  soft-tissue]
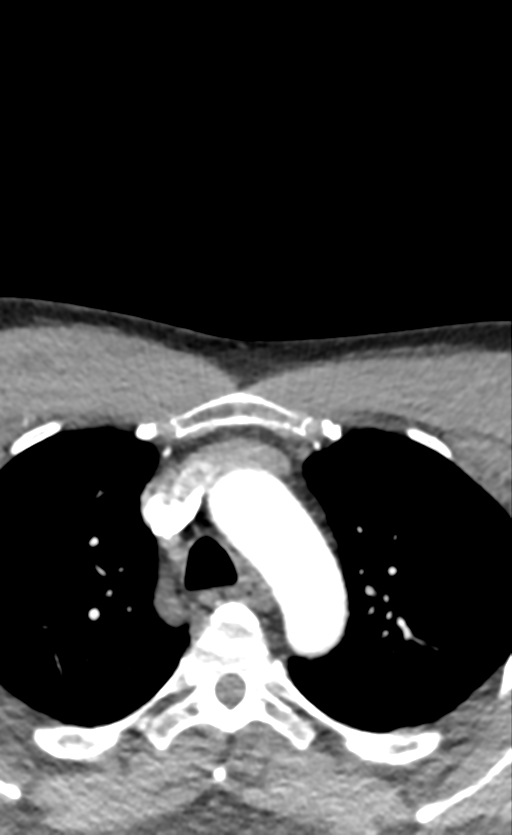
[im 72/252  bone]
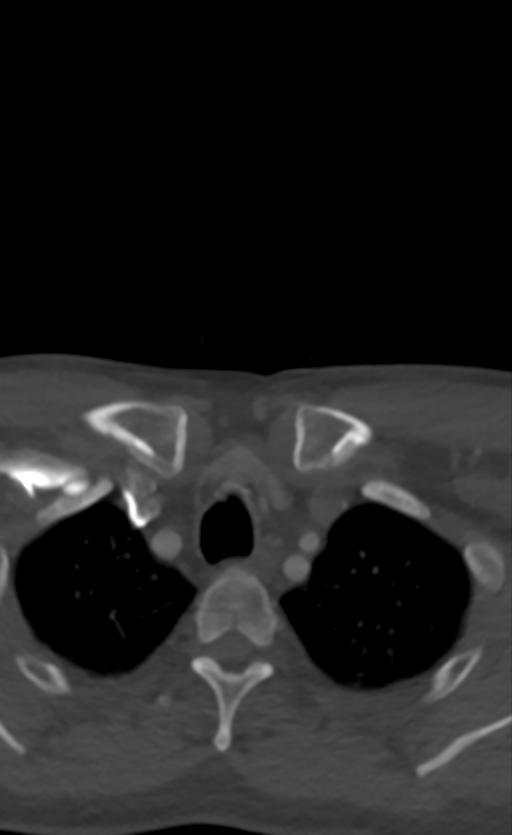
[im 108/252  soft-tissue]
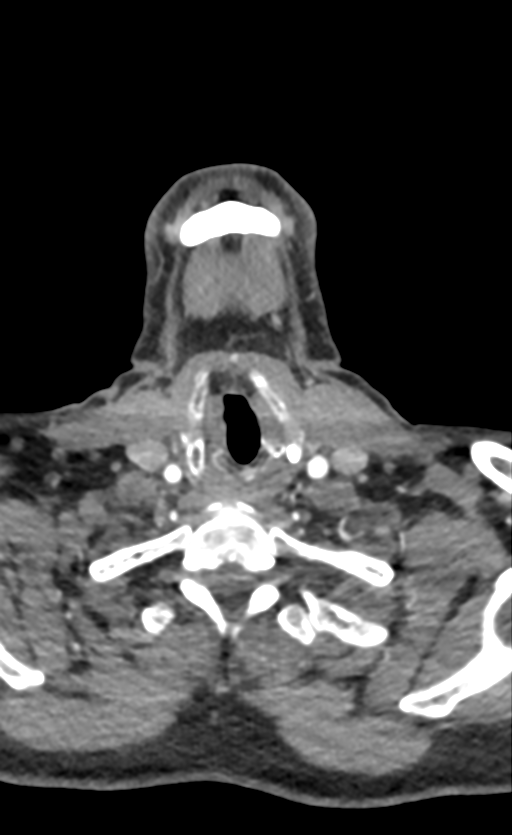
[im 144/252  bone]
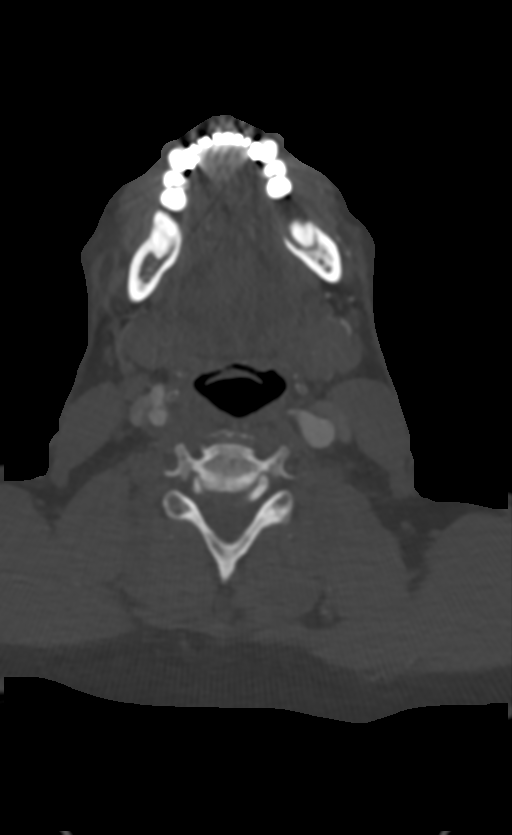
[im 180/252  soft-tissue]
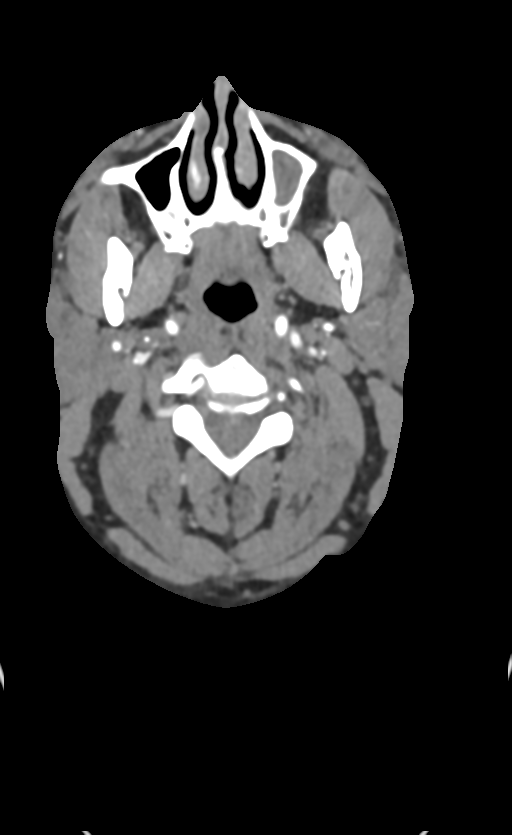
[im 216/252  bone]
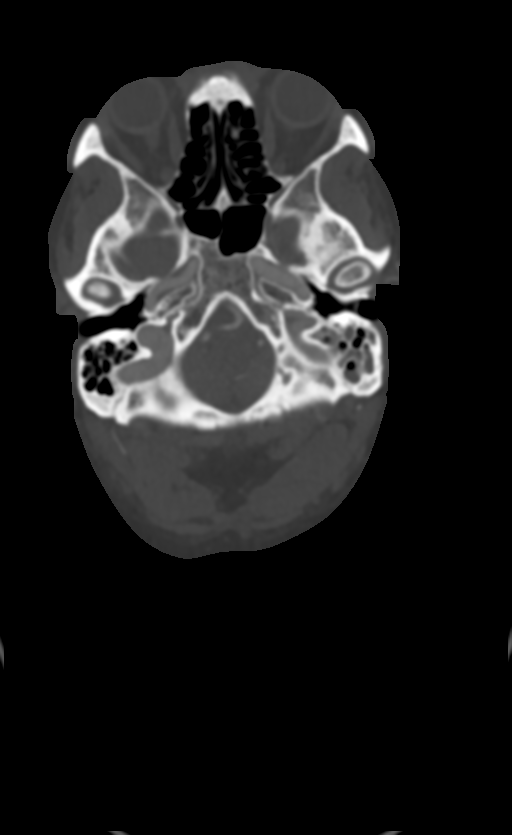

[8 of 33 positions shown; findings below may reference images not displayed]

RADIATION DOSE REDUCTION: This exam was performed according to the
departmental dose-optimization program which includes automated
exposure control, adjustment of the mA and/or kV according to
patient size and/or use of iterative reconstruction technique.

CONTRAST:  75mL OMNIPAQUE IOHEXOL 350 MG/ML SOLN
FINDINGS: Aortic arch: Normal with 3 vessel branching.

Right carotid system: Mild atheromatous plaque at the bifurcation.
No stenosis or ulceration. No evidence of dissection

Left carotid system: Mild atheromatous plaque at the bifurcation. No
stenosis or ulceration. No evidence of dissection.

Vertebral arteries: No proximal subclavian stenosis. The vertebral
arteries are smoothly contoured and widely patent to the dura. Just
beyond the left vertebral origin is a branch infundibulum that is
incidental.

Skeleton: Ordinary cervical spine degeneration.

Other neck: No visible soft tissue injury or inflammation.

Upper chest: Clear apical lungs
IMPRESSION: No acute vascular finding.  Mild carotid atherosclerosis.
